# Patient Record
Sex: Male | Born: 2000 | Race: Black or African American | Hispanic: No | Marital: Single | Smoking: Never smoker
Health system: Southern US, Community
[De-identification: ages and names within clinical notes are randomized; demographics above are authoritative.]

---
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
## 2021-05-23 ENCOUNTER — Emergency Department (HOSPITAL_COMMUNITY): Payer: No Typology Code available for payment source

## 2021-05-23 ENCOUNTER — Other Ambulatory Visit: Payer: Self-pay

## 2021-05-23 ENCOUNTER — Encounter (HOSPITAL_COMMUNITY): Payer: Self-pay

## 2021-05-23 ENCOUNTER — Emergency Department (HOSPITAL_COMMUNITY)
Admission: EM | Admit: 2021-05-23 | Discharge: 2021-05-23 | Disposition: A | Discharge status: Home / Self Care | Payer: No Typology Code available for payment source | Attending: Emergency Medicine | Admitting: Emergency Medicine

## 2021-05-23 DIAGNOSIS — Z23 Encounter for immunization: Secondary | ICD-10-CM | POA: Diagnosis not present

## 2021-05-23 DIAGNOSIS — S0990XA Unspecified injury of head, initial encounter: Secondary | ICD-10-CM

## 2021-05-23 DIAGNOSIS — W228XXA Striking against or struck by other objects, initial encounter: Secondary | ICD-10-CM | POA: Diagnosis not present

## 2021-05-23 DIAGNOSIS — S0101XA Laceration without foreign body of scalp, initial encounter: Secondary | ICD-10-CM | POA: Diagnosis not present

## 2021-05-23 MED ORDER — LIDOCAINE-EPINEPHRINE (PF) 2 %-1:200000 IJ SOLN
10.0000 mL | Freq: Once | INTRAMUSCULAR | Status: AC
  Administered 2021-05-23: 10 mL
  Filled 2021-05-23: qty 20

## 2021-05-23 MED ORDER — TETANUS-DIPHTH-ACELL PERTUSSIS 5-2.5-18.5 LF-MCG/0.5 IM SUSY
0.5000 mL | PREFILLED_SYRINGE | Freq: Once | INTRAMUSCULAR | Status: AC
  Administered 2021-05-23: 0.5 mL via INTRAMUSCULAR
  Filled 2021-05-23: qty 0.5

## 2021-05-23 MED ORDER — ONDANSETRON 4 MG PO TBDP: 4.0000 mg | ORAL_TABLET | Freq: Three times a day (TID) | ORAL | 0 refills | Status: AC | PRN

## 2021-05-23 MED ORDER — ACETAMINOPHEN 325 MG PO TABS
650.0000 mg | ORAL_TABLET | Freq: Once | ORAL | Status: AC
  Administered 2021-05-23: 650 mg via ORAL
  Filled 2021-05-23: qty 2

## 2021-05-23 MED ORDER — ACETAMINOPHEN ER 650 MG PO TBCR: 650.0000 mg | EXTENDED_RELEASE_TABLET | Freq: Three times a day (TID) | ORAL | 0 refills | Status: AC | PRN

## 2021-05-23 NOTE — ED Notes (Signed)
EDP at bedside  
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
## 2021-05-23 NOTE — Discharge Instructions (Signed)
It was a pleasure taking care of you today. As discussed, your CT scan did not show any bleeding in the brain. You could have sustained a concussion. Limit screen time for the next few days. You may take tylenol or ibuprofen as needed for pain. You will need your sutures removed in 4-5 days. Return to the ER for new or worsening symptoms.

## 2021-05-23 NOTE — ED Notes (Signed)
This writer went into place a non-adherent bandage over wound.  Pt asked to walk to the restroom.  While in restroom, wound started moderately bleeding.  Swelling noted to lower part of wound.  When this writer applied pressure, more blood came out of top and bottom sutures.  EDP made aware.
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
## 2021-05-23 NOTE — ED Notes (Signed)
Patient transported to CT 

## 2021-05-23 NOTE — ED Notes (Signed)
Pt shown to lobby and additional dressing supplies provided. Verbalized understanding discharge instructions and follow-up. In no acute distress.

## 2021-05-29 ENCOUNTER — Other Ambulatory Visit: Payer: Self-pay

## 2021-05-29 ENCOUNTER — Ambulatory Visit
Admission: EM | Admit: 2021-05-29 | Discharge: 2021-05-29 | Disposition: A | Discharge status: Home / Self Care | Payer: Medicaid Other

## 2021-05-29 ENCOUNTER — Encounter: Payer: Self-pay | Admitting: Emergency Medicine

## 2021-05-29 NOTE — ED Triage Notes (Signed)
Left forehead laceration appears well healed. Wound edges well approximated, no redness, warmth, swelling, drainage from site. Five sutures still in place, will remove shortly. ?

## 2021-06-07 ENCOUNTER — Ambulatory Visit (INDEPENDENT_AMBULATORY_CARE_PROVIDER_SITE_OTHER): Payer: Medicaid Other | Admitting: Clinical

## 2021-06-07 ENCOUNTER — Other Ambulatory Visit: Payer: Self-pay

## 2021-06-07 DIAGNOSIS — F331 Major depressive disorder, recurrent, moderate: Secondary | ICD-10-CM | POA: Diagnosis not present

## 2021-06-07 NOTE — Progress Notes (Signed)
Comprehensive Clinical Assessment (CCA) Note ? ?06/07/2021 ?Glenn SnowballShaquan Hodges ?[id number] ? ?Chief Complaint:  ?Chief Complaint  ?Patient presents with  ? Anxiety  ? Depression  ? ?Visit Diagnosis:  ?Major depressive disorder, recurrent episode, moderate with anxious distress ? ?Interpretive summary: ? Client is a 21 year old male presenting [facility] services.  Client reported he is self referring for clinical assessment.  Client presents with symptoms of anxiety and depression.  Client reported over the years he has been told by family that he needs to go talk with a professional regarding his mental health because of how he has coped with traumatic events that he is experienced.  Client reported having depressed mood, feeling on edge, and nightmares.  Client reported when he feels anxious he becomes hot, his rib cage shakes, and has difficulty breathing.  Client reported when the nightmares do occur it intense but they do not happen often.  Client reported there are things that he tries to avoid that may trigger the thought of past trauma events.  Client reported he has no prior history of inpatient and/or outpatient treatment for mental health reasons.  Client reported during his childhood he was taking medication for ADHD. Client reported use of marijuana on a daily basis. ?Client presented oriented x5, appropriately dressed, and cooperative.  Client denied hallucinations, delusions, suicidal, and homicidal ideations.  Client was screened for pain, nutrition, [place] suicide severity and the following S DOH: ? ?GAD 7 : Generalized Anxiety Score 06/07/2021  ?Nervous, Anxious, on Edge 2  ?Control/stop worrying 2  ?Worry too much - different things 2  ?Trouble relaxing 2  ?Restless 2  ?Easily annoyed or irritable 1  ?Afraid - awful might happen 1  ?Total GAD 7 Score 12  ?Anxiety Difficulty Very difficult  ? ?  ?Flowsheet Row Counselor from [facility]07/20[facility]  ?PHQ-9 Total Score 8  ? ?  ?  ? ?Treatment recommendations: individual therapy and psychiatry with medication management ? ?Therapist provided information on format of appointment (virtual or face to face).  ? ?The client was advised to call back or seek an in-person evaluation if the symptoms worsen or if the condition fails to improve as anticipated before the next scheduled appointment. ?Client was in agreement with treatment recommendations. ? ? ?CCA Biopsychosocial ?Intake/Chief Complaint:  Client reported he is presenting by self referral for the symptoms of trauma that he was not dealing with in his life. Client reported his family said it was progressively getting worse. ? ?Current Symptoms/Problems: Client reported having depressed mood, feeling on edge, nightmares. Client reported it has been going on for 10 years. ? ?Patient Reported Schizophrenia/Schizoaffective Diagnosis in Past: No ? ?Strengths: family support ? ?Preferences: therapy ? ?Abilities: able to ask for help ? ?Type of Services Patient Feels are Needed: individual therapy ? ?Initial Clinical Notes/Concerns: No data recorded ? ?Mental Health Symptoms ?Depression:   ?Change in energy/activity; Irritability; Increase/decrease in appetite; Sleep (too much or little) ?  ?Duration of Depressive symptoms:  ?Greater than two weeks ?  ?Mania:   ?None ?  ?Anxiety:    ?Irritability; Tension ?  ?Psychosis:   ?None ?  ?Duration of Psychotic symptoms: No data recorded  ?Trauma:   ?Avoids reminders of event ?  ?Obsessions:   ?None ?  ?Compulsions:   ?None ?  ?Inattention:   ?None ?  ?Hyperactivity/Impulsivity:   ?None ?  ?Oppositional/Defiant Behaviors:   ?None ?  ?Emotional Irregularity:   ?  None ?  ?Other Mood/Personality Symptoms:  No data recorded  ? ?Mental Status Exam ?Appearance and self-care  ?Stature:   ?Average ?  ?Weight:   ?Average weight ?  ?Clothing:   ?Casual ?  ?Grooming:   ?Normal ?  ?Cosmetic use:   ?Age  appropriate ?  ?Posture/gait:   ?Normal ?  ?Motor activity:   ?Not Remarkable ?  ?Sensorium  ?Attention:   ?Normal ?  ?Concentration:   ?Normal ?  ?Orientation:   ?X5 ?  ?Recall/memory:   ?Normal ?  ?Affect and Mood  ?Affect:   ?Congruent ?  ?Mood:   ?Anxious ?  ?Relating  ?Eye contact:   ?Normal ?  ?Facial expression:   ?Responsive ?  ?Attitude toward examiner:   ?Cooperative ?  ?Thought and Language  ?Speech flow:  ?Clear and Coherent ?  ?Thought content:   ?Appropriate to Mood and Circumstances ?  ?Preoccupation:   ?None ?  ?Hallucinations:   ?None ?  ?Organization:  No data recorded  ?Executive Functions  ?Fund of Knowledge:   ?Good ?  ?Intelligence:   ?Average ?  ?Abstraction:   ?Normal ?  ?Judgement:   ?Good ?  ?Reality Testing:   ?Adequate ?  ?Insight:   ?Good ?  ?Decision Making:   ?Normal ?  ?Social Functioning  ?Social Maturity:   ?Responsible ?  ?Social Judgement:   ?Normal ?  ?Stress  ?Stressors:   ?Family conflict; Financial ?  ?Coping Ability:   ?Overwhelmed ?  ?Skill Deficits:   ?Activities of daily living; Self-care ?  ?Supports:   ?Family ?  ? ? ?Religion: ?Religion/Spirituality ?Are You A Religious Person?: No ? ?Leisure/Recreation: ?Leisure / Recreation ?Do You Have Hobbies?: No ? ?Exercise/Diet: ?Exercise/Diet ?Do You Exercise?: No ?Have You Gained or Lost A Significant Amount of Weight in the Past Six Months?: No ?Do You Follow a Special Diet?: No ?Do You Have Any Trouble Sleeping?: Yes ? ? ?CCA Employment/Education ?Employment/Work Situation: ?Employment / Work Situation ?Employment Situation: Employed ?How Long has Patient Been Employed?: 2 months ? ?Education: ?Education ?Did You Graduate From [employer]?: Yes ? ? ?CCA Family/Childhood History ?Family and Relationship History: ?Family history ?Marital status: Single ?Does patient have children?: No ? ?Childhood History:  ?Childhood History ?Additional childhood history information: Client reported he is from [place] and raised by both  parents. Client reported he childhood was inconsistent. ?Does patient have siblings?: Yes ?Did patient suffer any verbal/emotional/physical/sexual abuse as a child?: No ?Did patient suffer from severe childhood neglect?: No ?Has patient ever been sexually abused/assaulted/raped as an adolescent or adult?: No ?Was the patient ever a victim of a crime or a disaster?: No (Client reported he has hd some trauma experiences but declinded to detail about the events.) ?Witnessed domestic violence?: No ?Has patient been affected by domestic violence as an adult?: No ? ?Child/Adolescent Assessment: ?  ? ? ?CCA Substance Use ?Alcohol/Drug Use: ?Alcohol / Drug Use ?History of alcohol / drug use?: No history of alcohol / drug abuse ?  ?  ?  ?  ?   ?  ?  ?  ?  ?  ? ? ? ?ASAM's:  Six Dimensions of Multidimensional Assessment ? ?Dimension 1:  Acute Intoxication and/or Withdrawal Potential:   ?   ?Dimension 2:  Biomedical Conditions and Complications:   ?   ?Dimension 3:  Emotional, Behavioral, or Cognitive Conditions and Complications:     ?Dimension 4:  Readiness to Change:     ?Dimension 5:  Relapse, Continued use, or Continued Problem Potential:     ?Dimension 6:  Recovery/Living Environment:     ?ASAM Severity Score:    ?ASAM Recommended Level of Treatment:    ? ?Substance use Disorder (SUD) ?  ? ?Recommendations for Services/Supports/Treatments: ?Recommendations for Services/Supports/Treatments ?Recommendations For Services/Supports/Treatments: Medication Management, Individual Therapy ? ?DSM5 Diagnoses: ?There are no problems to display for this patient. ? ? ?Patient Centered Plan: ?Patient is on the following Treatment Plan(s):  Depression ? ? ?Referrals to Alternative Service(s): ?Referred to Alternative Service(s):   Place:   Date:   Time:    ?Referred to Alternative Service(s):   Place:   Date:   Time:    ?Referred to Alternative Service(s):   Place:   Date:   Time:    ?Referred to Alternative Service(s):   Place:   Date:    Time:    ? ? ?Collaboration of Care: Patient refused AEB no other resources requested by the client at this time. ? ?Patient/Guardian was advised Release of Information must be obtained prior to any record

## 2021-06-07 NOTE — Plan of Care (Signed)
Client was in agreement to the treatment plan. °

## 2021-07-18 ENCOUNTER — Ambulatory Visit (INDEPENDENT_AMBULATORY_CARE_PROVIDER_SITE_OTHER): Payer: Medicaid Other | Admitting: Physician Assistant

## 2021-07-18 ENCOUNTER — Encounter (HOSPITAL_COMMUNITY): Payer: Self-pay | Admitting: Physician Assistant

## 2021-07-18 VITALS — BP 125/73 | HR 68 | Ht 69.0 in | Wt 170.0 lb

## 2021-07-18 DIAGNOSIS — F3132 Bipolar disorder, current episode depressed, moderate: Secondary | ICD-10-CM

## 2021-07-18 DIAGNOSIS — F411 Generalized anxiety disorder: Secondary | ICD-10-CM

## 2021-07-18 MED ORDER — LAMOTRIGINE 25 MG PO TABS: 25.0000 mg | ORAL_TABLET | Freq: Every day | ORAL | 1 refills | Status: AC

## 2021-07-18 MED ORDER — QUETIAPINE FUMARATE 50 MG PO TABS: 50.0000 mg | ORAL_TABLET | Freq: Every day | ORAL | 1 refills | Status: AC
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
## 2021-07-19 ENCOUNTER — Emergency Department (HOSPITAL_COMMUNITY)
Admission: EM | Admit: 2021-07-19 | Discharge: 2021-07-19 | Disposition: A | Discharge status: Home / Self Care | Payer: Medicaid Other | Attending: Emergency Medicine | Admitting: Emergency Medicine

## 2021-07-19 ENCOUNTER — Telehealth (HOSPITAL_COMMUNITY): Payer: Self-pay | Admitting: *Deleted

## 2021-07-19 ENCOUNTER — Emergency Department (HOSPITAL_COMMUNITY): Payer: Medicaid Other

## 2021-07-19 DIAGNOSIS — R55 Syncope and collapse: Secondary | ICD-10-CM | POA: Diagnosis not present

## 2021-07-19 DIAGNOSIS — H5712 Ocular pain, left eye: Secondary | ICD-10-CM | POA: Insufficient documentation

## 2021-07-19 DIAGNOSIS — S0990XA Unspecified injury of head, initial encounter: Secondary | ICD-10-CM | POA: Diagnosis present

## 2021-07-19 DIAGNOSIS — W01198A Fall on same level from slipping, tripping and stumbling with subsequent striking against other object, initial encounter: Secondary | ICD-10-CM | POA: Diagnosis not present

## 2021-07-19 LAB — CBC WITH DIFFERENTIAL/PLATELET
Abs Immature Granulocytes: 0.01 10*3/uL (ref 0.00–0.07)
Basophils Absolute: 0 10*3/uL (ref 0.0–0.1)
Basophils Relative: 1 %
Eosinophils Absolute: 0.1 10*3/uL (ref 0.0–0.5)
Eosinophils Relative: 2 %
HCT: 45.1 % (ref 39.0–52.0)
Hemoglobin: 14.3 g/dL (ref 13.0–17.0)
Immature Granulocytes: 0 %
Lymphocytes Relative: 38 %
Lymphs Abs: 1.7 10*3/uL (ref 0.7–4.0)
MCH: 32 pg (ref 26.0–34.0)
MCHC: 31.7 g/dL (ref 30.0–36.0)
MCV: 100.9 fL — ABNORMAL HIGH (ref 80.0–100.0)
Monocytes Absolute: 0.6 10*3/uL (ref 0.1–1.0)
Monocytes Relative: 12 %
Neutro Abs: 2.2 10*3/uL (ref 1.7–7.7)
Neutrophils Relative %: 47 %
Platelets: 157 10*3/uL (ref 150–400)
RBC: 4.47 MIL/uL (ref 4.22–5.81)
RDW: 12.4 % (ref 11.5–15.5)
WBC: 4.6 10*3/uL (ref 4.0–10.5)
nRBC: 0 % (ref 0.0–0.2)

## 2021-07-19 LAB — BASIC METABOLIC PANEL
Anion gap: 6 (ref 5–15)
BUN: 13 mg/dL (ref 6–20)
CO2: 25 mmol/L (ref 22–32)
Calcium: 8.9 mg/dL (ref 8.9–10.3)
Chloride: 108 mmol/L (ref 98–111)
Creatinine, Ser: 1.15 mg/dL (ref 0.61–1.24)
GFR, Estimated: >60 mL/min (ref >60)
Glucose, Bld: 90 mg/dL (ref 70–99)
Potassium: 4.2 mmol/L (ref 3.5–5.1)
Sodium: 139 mmol/L (ref 135–145)

## 2021-07-19 MED ORDER — ACETAMINOPHEN 160 MG/5ML PO SOLN: 650.0000 mg | Freq: Once | ORAL | Status: DC
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
## 2021-07-19 NOTE — Discharge Instructions (Addendum)
Please return to the emergency department for any worsening symptoms.  Your symptoms seem consistent with a concussion.  This will take time to get better but should resolve on its own. ?
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
## 2021-07-19 NOTE — Telephone Encounter (Signed)
[facility]  [place] HAS APPROVED  ? ?QUEtiapine (SEROQUEL) 50 MG tablet ?Take 1 tablet (50 mg total) by mouth at bedtime  ? ?#90 FOR 90 DAYS APPROVED 12 MONTHS ? ?07/19/2021  TO 07/20/2022 ?

## 2021-07-19 NOTE — ED Notes (Signed)
Pt verbalizes understanding of discharge instructions. Opportunity for questions and answers were provided. Pt discharged from the ED.   ?
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
## 2021-08-07 ENCOUNTER — Ambulatory Visit (HOSPITAL_COMMUNITY): Payer: Self-pay | Admitting: Clinical

## 2021-08-08 ENCOUNTER — Encounter: Payer: Self-pay | Admitting: Neurology

## 2021-08-08 ENCOUNTER — Ambulatory Visit: Payer: Medicaid Other | Admitting: Neurology

## 2021-08-22 ENCOUNTER — Ambulatory Visit (HOSPITAL_COMMUNITY): Payer: Medicaid Other | Admitting: Physician Assistant

## 2021-08-28 ENCOUNTER — Ambulatory Visit (HOSPITAL_COMMUNITY): Payer: Self-pay | Admitting: Clinical

## 2022-09-20 IMAGING — CT CT HEAD W/O CM
3 series · 14 of 47 positions shown, 16 images · non-contrast
Comparison: CT head May 23, 2021.

CLINICAL DATA: Syncope/presyncope, cerebrovascular cause suspected



[Series 3: head 5.0 h30s · axial · 0.46mm/px · z∈[-239,-114]mm · 8 of 31 slices shown, 10 images]
[im 3/31  brain]
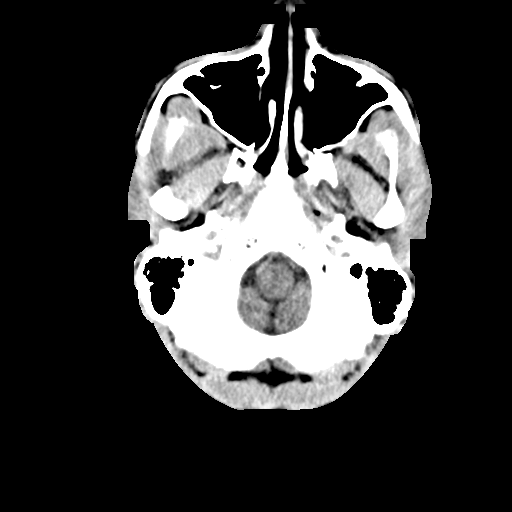
[im 3/31  bone]
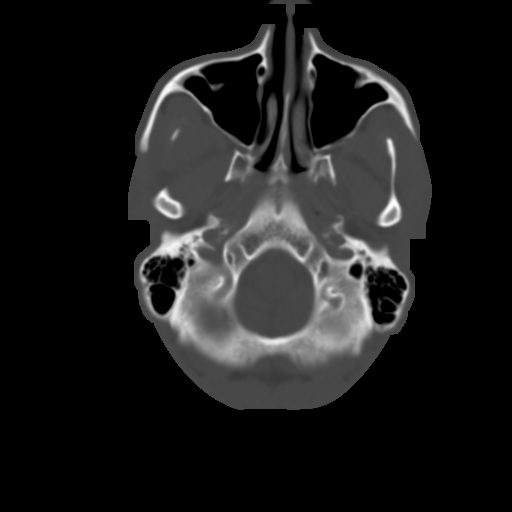
[im 7/31  brain]
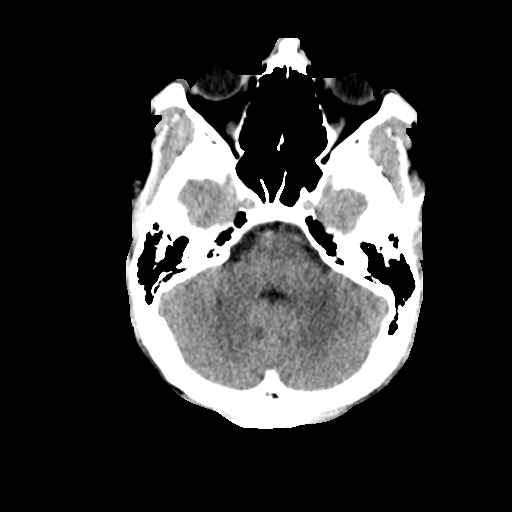
[im 10/31  brain]
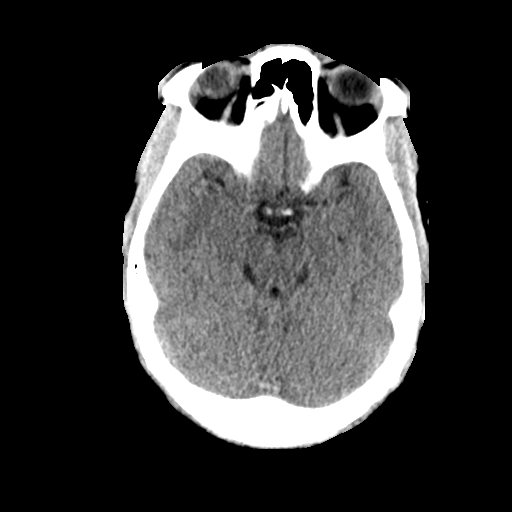
[im 14/31  brain]
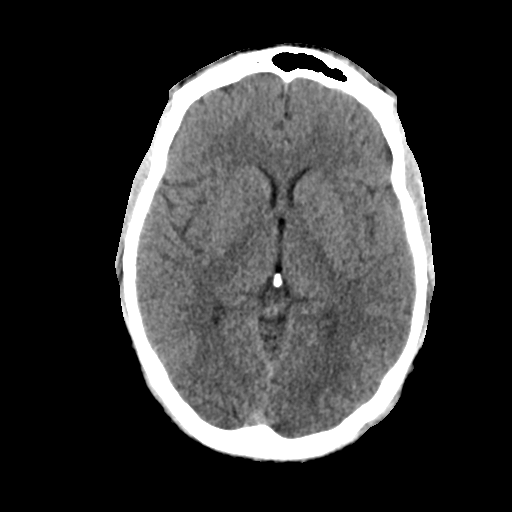
[im 17/31  brain]
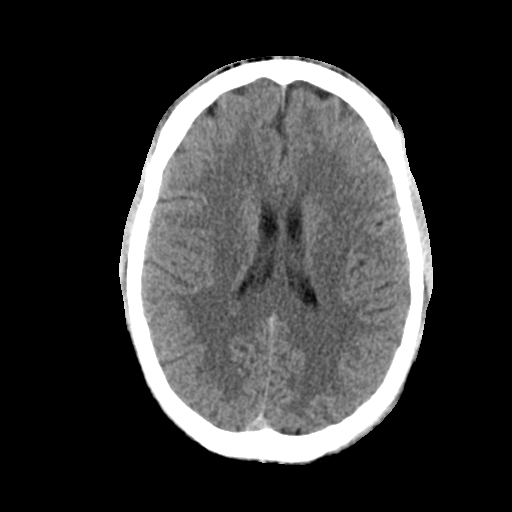
[im 17/31  bone]
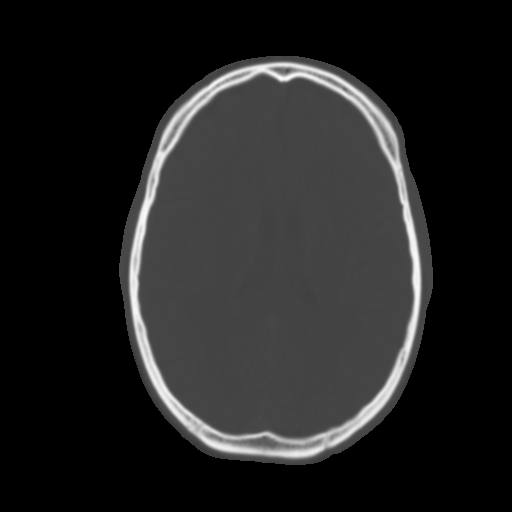
[im 21/31  brain]
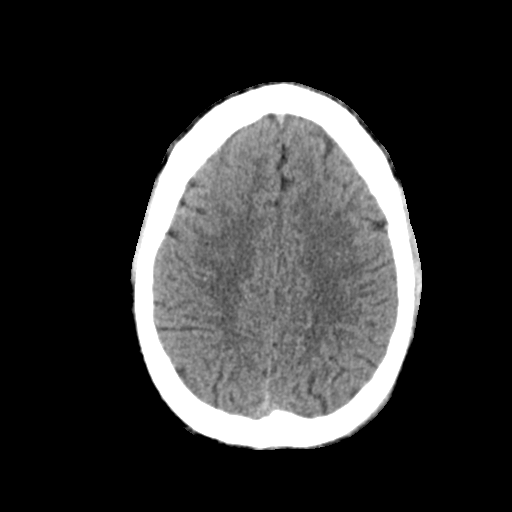
[im 24/31  brain]
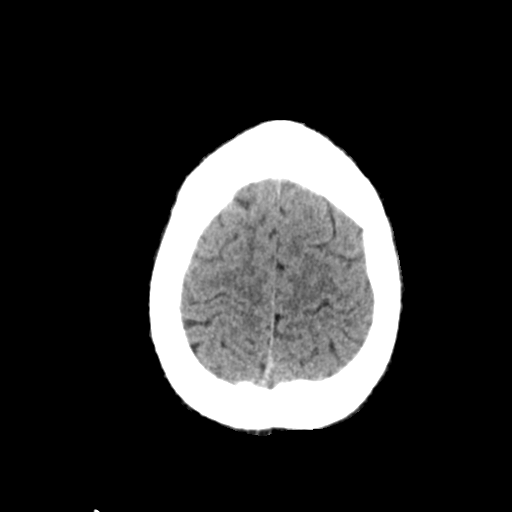
[im 28/31  brain]
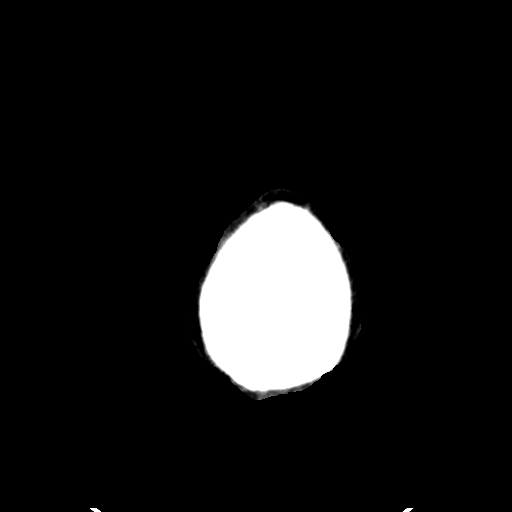

[Series 5: head 3.0 mpr cor · coronal · 0.30mm/px · 3 of 71 slices shown]
[im 24/71  brain]
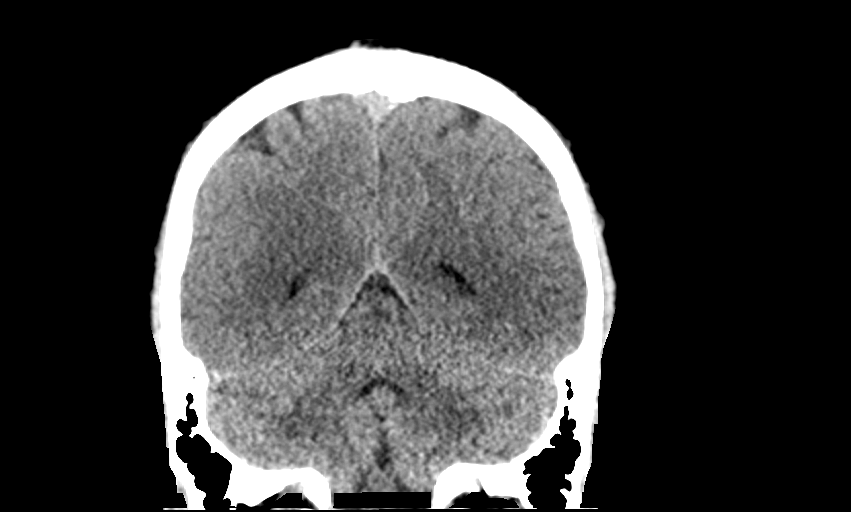
[im 32/71  brain]
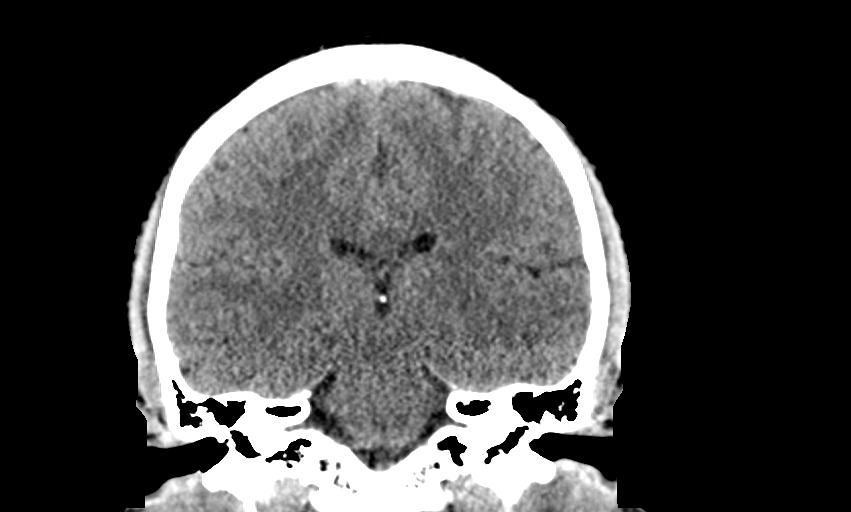
[im 39/71  brain]
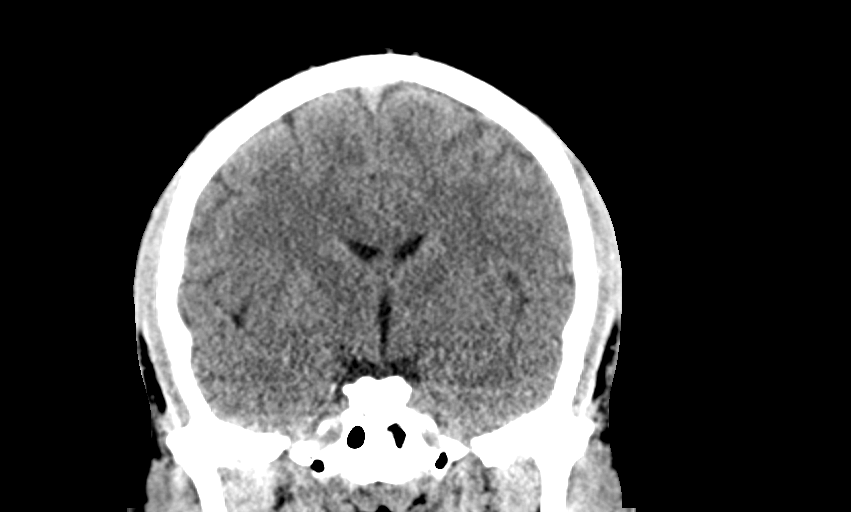

[Series 6: head 3.0 mpr sag · sagittal · 0.30mm/px · 3 of 58 slices shown]
[im 20/58  brain]
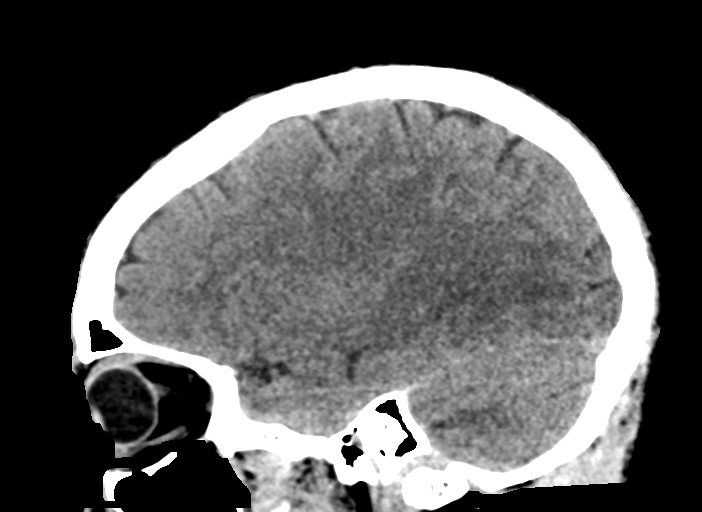
[im 29/58  brain]
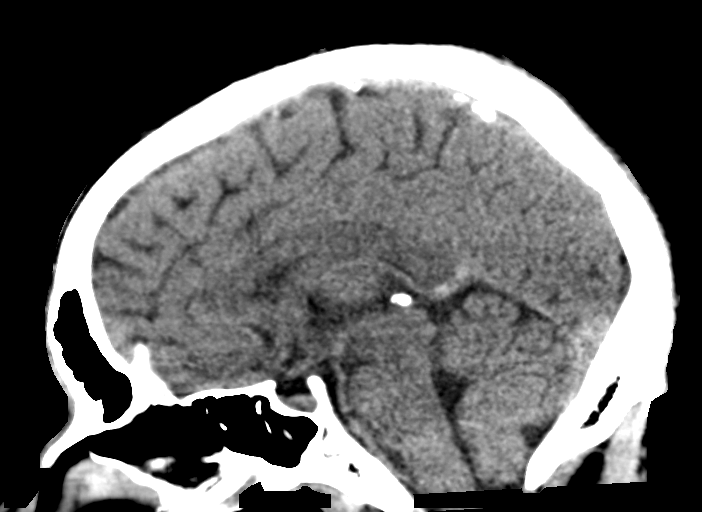
[im 39/58  brain]
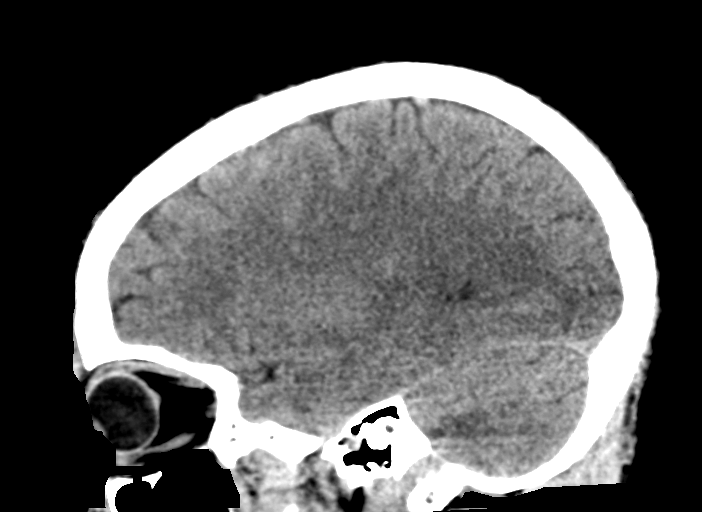

[14 of 47 positions shown; findings below may reference images not displayed]

FINDINGS: Brain: No evidence of acute large vascular territory infarction,
hemorrhage, hydrocephalus, extra-axial collection or mass
lesion/mass effect.

Vascular: No hyperdense vessel identified.

Skull: No acute fracture.

Sinuses/Orbits: Visualized sinuses are clear. No acute orbital
findings.

Other: No mastoid effusions.
IMPRESSION: No evidence of acute intracranial abnormality.
